# Patient Record
Sex: Male | Born: 1950
Health system: Southern US, Community
[De-identification: ages and names within clinical notes are randomized; demographics above are authoritative.]

---

## 2001-03-23 ENCOUNTER — Ambulatory Visit (HOSPITAL_BASED_OUTPATIENT_CLINIC_OR_DEPARTMENT_OTHER): Admission: RE | Admit: 2001-03-23 | Discharge: 2001-03-23 | Payer: Self-pay | Admitting: Plastic Surgery

## 2001-03-23 ENCOUNTER — Encounter (INDEPENDENT_AMBULATORY_CARE_PROVIDER_SITE_OTHER): Payer: Self-pay | Admitting: *Deleted

## 2010-08-26 ENCOUNTER — Encounter (INDEPENDENT_AMBULATORY_CARE_PROVIDER_SITE_OTHER): Payer: Self-pay | Admitting: General Surgery

## 2010-08-26 ENCOUNTER — Inpatient Hospital Stay (HOSPITAL_COMMUNITY)
Admission: RE | Admit: 2010-08-26 | Discharge: 2010-08-31 | Payer: Self-pay | Source: Home / Self Care | Attending: General Surgery | Admitting: General Surgery

## 2010-08-27 LAB — BASIC METABOLIC PANEL
BUN: 15 mg/dL (ref 6–23)
CO2: 28 mEq/L (ref 19–32)
Calcium: 8.2 mg/dL — ABNORMAL LOW (ref 8.4–10.5)
Chloride: 105 mEq/L (ref 96–112)
Creatinine, Ser: 1.28 mg/dL (ref 0.4–1.5)
GFR calc Af Amer: >60 mL/min (ref >60)
GFR calc non Af Amer: 58 mL/min — ABNORMAL LOW (ref >60)
Glucose, Bld: 156 mg/dL — ABNORMAL HIGH (ref 70–99)
Potassium: 4.4 mEq/L (ref 3.5–5.1)
Sodium: 137 mEq/L (ref 135–145)

## 2010-08-27 LAB — CBC
HCT: 33.2 % — ABNORMAL LOW (ref 39.0–52.0)
Hemoglobin: 11.2 g/dL — ABNORMAL LOW (ref 13.0–17.0)
MCH: 32.8 pg (ref 26.0–34.0)
MCHC: 33.7 g/dL (ref 30.0–36.0)
MCV: 97.4 fL (ref 78.0–100.0)
Platelets: 192 10*3/uL (ref 150–400)
RBC: 3.41 MIL/uL — ABNORMAL LOW (ref 4.22–5.81)
RDW: 12.6 % (ref 11.5–15.5)
WBC: 11 10*3/uL — ABNORMAL HIGH (ref 4.0–10.5)

## 2010-08-29 LAB — CBC
HCT: 29.7 % — ABNORMAL LOW (ref 39.0–52.0)
Hemoglobin: 10 g/dL — ABNORMAL LOW (ref 13.0–17.0)
MCH: 32.8 pg (ref 26.0–34.0)
MCHC: 33.7 g/dL (ref 30.0–36.0)
MCV: 97.4 fL (ref 78.0–100.0)
Platelets: 160 10*3/uL (ref 150–400)
RBC: 3.05 MIL/uL — ABNORMAL LOW (ref 4.22–5.81)
RDW: 12.3 % (ref 11.5–15.5)
WBC: 6.8 10*3/uL (ref 4.0–10.5)

## 2010-08-29 LAB — BASIC METABOLIC PANEL
BUN: 5 mg/dL — ABNORMAL LOW (ref 6–23)
CO2: 28 mEq/L (ref 19–32)
Calcium: 8.4 mg/dL (ref 8.4–10.5)
Chloride: 102 mEq/L (ref 96–112)
Creatinine, Ser: 1.07 mg/dL (ref 0.4–1.5)
GFR calc Af Amer: >60 mL/min (ref >60)
GFR calc non Af Amer: >60 mL/min (ref >60)
Glucose, Bld: 131 mg/dL — ABNORMAL HIGH (ref 70–99)
Potassium: 4.1 mEq/L (ref 3.5–5.1)
Sodium: 135 mEq/L (ref 135–145)

## 2010-09-19 NOTE — Discharge Summary (Addendum)
  NAME:  Charles Jackson, Charles Jackson               ACCOUNT NO.:  [id number]  MEDICAL RECORD NO.:  [id number]          PATIENT TYPE:  INP  LOCATION:  1531                         FACILITY:  [facility]  PHYSICIAN:  Angelia Mould. Derrell Lolling, M.D.DATE OF BIRTH:  [birth year]  DATE OF ADMISSION:  08/26/2010 DATE OF DISCHARGE:  08/31/2010                              DISCHARGE SUMMARY   FINAL DIAGNOSES: 1. 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 2. Tobacco abuse.  OPERATIONS PERFORMED:  Laparoscopic-assisted right colectomy, date August 26, 2010.  HISTORY:  This is a 60 year old Caucasian man who is asymptomatic and had a screening colonoscopy, which showed a 3 cm mass in the cecum, possibly associated with the appendiceal orifice.  Biopsy showed villous adenoma with high-grade dysplasia.  He was referred for surgical management.  He was counseled as an outpatient.  He underwent bowel prep at home and was brought to the operating room electively.  HOSPITAL COURSE:  On the day of admission, the patient underwent a laparoscopic-assisted right colectomy.  He had a 3 cm soft palpable mass in the cecum near the ileocecal valve.  The appendix looked normal. There were no enlarged lymph nodes.  The rest of the exploration was normal.  Final pathology report showed intra- mucosal adenocarcinoma arising in a large tubulovillous polyp.  There was no evidence of lymphovascular invasion.  Twenty two lymph nodes were evaluated and all of these were negative for cancer.  The resection margins were clear.  Postoperatively, the patient did well.  He had an ileus for a couple of days.  He was placed on usual postoperative management protocols and care path.  His ileus resolved after about 3 or 4 days.  He began having bowel movements and was advanced to a regular diet.  He was discharged home on January 8.  At that time, he was having bowel movements, feeling  well, and was ready to go home.  His wounds looked good.  He was given instructions in diet, activities.  He was given a prescription for Vicodin for pain.  He was asked to return to see me in the office in 1 week for wound check.  His pathology report was discussed with him.     Angelia Mould. Derrell Lolling, M.D.     HMI/MEDQ  D:  09/18/2010  T:  09/18/2010  Job:  [id number]  cc:   Willis Modena, MD Fax: [phone]  Milus Height, PA-C  Electronically Signed by Claud Kelp M.D. on 09/19/2010 08:59:07 AM

## 2010-11-03 LAB — COMPREHENSIVE METABOLIC PANEL
ALT: 18 U/L (ref 0–53)
AST: 22 U/L (ref 0–37)
Albumin: 3.8 g/dL (ref 3.5–5.2)
Alkaline Phosphatase: 79 U/L (ref 39–117)
BUN: 17 mg/dL (ref 6–23)
CO2: 29 mEq/L (ref 19–32)
Calcium: 9.5 mg/dL (ref 8.4–10.5)
Chloride: 105 mEq/L (ref 96–112)
Creatinine, Ser: 1.18 mg/dL (ref 0.4–1.5)
GFR calc Af Amer: >60 mL/min (ref >60)
GFR calc non Af Amer: >60 mL/min (ref >60)
Glucose, Bld: 103 mg/dL — ABNORMAL HIGH (ref 70–99)
Potassium: 5.3 mEq/L — ABNORMAL HIGH (ref 3.5–5.1)
Sodium: 140 mEq/L (ref 135–145)
Total Bilirubin: 0.4 mg/dL (ref 0.3–1.2)
Total Protein: 7.1 g/dL (ref 6.0–8.3)

## 2010-11-03 LAB — URINALYSIS, ROUTINE W REFLEX MICROSCOPIC
Bilirubin Urine: NEGATIVE
Glucose, UA: NEGATIVE mg/dL
Hgb urine dipstick: NEGATIVE
Ketones, ur: NEGATIVE mg/dL
Nitrite: NEGATIVE
Protein, ur: NEGATIVE mg/dL
Specific Gravity, Urine: 1.004 — ABNORMAL LOW (ref 1.005–1.030)
Urobilinogen, UA: 0.2 mg/dL (ref 0.0–1.0)
pH: 5.5 (ref 5.0–8.0)

## 2010-11-03 LAB — CBC
HCT: 45.9 % (ref 39.0–52.0)
Hemoglobin: 15.2 g/dL (ref 13.0–17.0)
MCH: 32.6 pg (ref 26.0–34.0)
MCHC: 33.1 g/dL (ref 30.0–36.0)
MCV: 98.5 fL (ref 78.0–100.0)
Platelets: 257 10*3/uL (ref 150–400)
RBC: 4.66 MIL/uL (ref 4.22–5.81)
RDW: 12.9 % (ref 11.5–15.5)
WBC: 6.9 10*3/uL (ref 4.0–10.5)

## 2010-11-03 LAB — DIFFERENTIAL
Basophils Absolute: 0 10*3/uL (ref 0.0–0.1)
Basophils Relative: 1 % (ref 0–1)
Eosinophils Absolute: 0.2 10*3/uL (ref 0.0–0.7)
Eosinophils Relative: 2 % (ref 0–5)
Lymphocytes Relative: 27 % (ref 12–46)
Lymphs Abs: 1.8 10*3/uL (ref 0.7–4.0)
Monocytes Absolute: 0.6 10*3/uL (ref 0.1–1.0)
Monocytes Relative: 8 % (ref 3–12)
Neutro Abs: 4.3 10*3/uL (ref 1.7–7.7)
Neutrophils Relative %: 63 % (ref 43–77)

## 2010-11-03 LAB — SURGICAL PCR SCREEN
MRSA, PCR: NEGATIVE
Staphylococcus aureus: NEGATIVE

## 2011-08-14 ENCOUNTER — Other Ambulatory Visit: Payer: Self-pay | Admitting: Gastroenterology

## 2011-09-14 ENCOUNTER — Encounter (INDEPENDENT_AMBULATORY_CARE_PROVIDER_SITE_OTHER): Payer: Self-pay | Admitting: Gastroenterology

## 2018-03-02 DIAGNOSIS — H3561 Retinal hemorrhage, right eye: Secondary | ICD-10-CM | POA: Diagnosis not present

## 2018-04-11 ENCOUNTER — Other Ambulatory Visit: Payer: Self-pay | Admitting: *Deleted

## 2018-04-11 NOTE — Patient Outreach (Addendum)
Telephone call to pt for follow up on HTA health risk assessment completed,  RN CM attempted 3 times to reach pt on 03/28/18, 04/04/18 and 04/08/18, no answer to telephone and left several voicemails requesting return phone call.  Pt did not return calls. Unsuccessful outreach letter mailed to pt home with 24 hour nurse line magnet.  Irving ShowsJulie Jalea Bronaugh Kil[facility]arch[facility] Residential Treatment CenterRNC, BSN [facility] Community Care Coordinator [phone]

## 2018-05-30 ENCOUNTER — Other Ambulatory Visit: Payer: Self-pay | Admitting: Physician Assistant

## 2018-05-30 DIAGNOSIS — Z136 Encounter for screening for cardiovascular disorders: Secondary | ICD-10-CM

## 2018-05-30 DIAGNOSIS — Z23 Encounter for immunization: Secondary | ICD-10-CM | POA: Diagnosis not present

## 2018-05-30 DIAGNOSIS — Z85038 Personal history of other malignant neoplasm of large intestine: Secondary | ICD-10-CM | POA: Diagnosis not present

## 2018-05-30 DIAGNOSIS — Z1159 Encounter for screening for other viral diseases: Secondary | ICD-10-CM | POA: Diagnosis not present

## 2018-05-30 DIAGNOSIS — Z0000 Encounter for general adult medical examination without abnormal findings: Secondary | ICD-10-CM | POA: Diagnosis not present

## 2018-05-30 DIAGNOSIS — Z125 Encounter for screening for malignant neoplasm of prostate: Secondary | ICD-10-CM | POA: Diagnosis not present

## 2018-05-30 DIAGNOSIS — E7800 Pure hypercholesterolemia, unspecified: Secondary | ICD-10-CM | POA: Diagnosis not present

## 2018-06-02 DIAGNOSIS — H3561 Retinal hemorrhage, right eye: Secondary | ICD-10-CM | POA: Diagnosis not present

## 2018-06-07 ENCOUNTER — Ambulatory Visit
Admission: RE | Admit: 2018-06-07 | Discharge: 2018-06-07 | Disposition: A | Discharge status: Home / Self Care | Payer: Self-pay | Source: Ambulatory Visit | Attending: Physician Assistant | Admitting: Physician Assistant

## 2018-06-07 DIAGNOSIS — Z136 Encounter for screening for cardiovascular disorders: Secondary | ICD-10-CM | POA: Diagnosis not present

## 2018-06-07 DIAGNOSIS — Z87891 Personal history of nicotine dependence: Secondary | ICD-10-CM | POA: Diagnosis not present

## 2018-09-12 DIAGNOSIS — E7800 Pure hypercholesterolemia, unspecified: Secondary | ICD-10-CM | POA: Diagnosis not present

## 2018-09-26 DIAGNOSIS — E7800 Pure hypercholesterolemia, unspecified: Secondary | ICD-10-CM | POA: Diagnosis not present

## 2019-01-02 DIAGNOSIS — N289 Disorder of kidney and ureter, unspecified: Secondary | ICD-10-CM | POA: Diagnosis not present

## 2019-06-12 DIAGNOSIS — E7800 Pure hypercholesterolemia, unspecified: Secondary | ICD-10-CM | POA: Diagnosis not present

## 2019-06-12 DIAGNOSIS — Z125 Encounter for screening for malignant neoplasm of prostate: Secondary | ICD-10-CM | POA: Diagnosis not present

## 2019-06-12 DIAGNOSIS — Z131 Encounter for screening for diabetes mellitus: Secondary | ICD-10-CM | POA: Diagnosis not present

## 2019-06-12 DIAGNOSIS — Z85038 Personal history of other malignant neoplasm of large intestine: Secondary | ICD-10-CM | POA: Diagnosis not present

## 2019-06-12 DIAGNOSIS — Z0000 Encounter for general adult medical examination without abnormal findings: Secondary | ICD-10-CM | POA: Diagnosis not present

## 2019-06-12 DIAGNOSIS — Z23 Encounter for immunization: Secondary | ICD-10-CM | POA: Diagnosis not present

## 2019-09-13 DIAGNOSIS — R739 Hyperglycemia, unspecified: Secondary | ICD-10-CM | POA: Diagnosis not present

## 2019-09-13 DIAGNOSIS — E7800 Pure hypercholesterolemia, unspecified: Secondary | ICD-10-CM | POA: Diagnosis not present

## 2019-10-26 ENCOUNTER — Ambulatory Visit: Payer: Medicare HMO | Attending: Internal Medicine

## 2019-10-26 DIAGNOSIS — Z23 Encounter for immunization: Secondary | ICD-10-CM | POA: Insufficient documentation

## 2019-10-26 NOTE — Progress Notes (Signed)
   Covid-19 Vaccination Clinic  Name:  Charles Jackson    MRN: [id number] DOB: Jul 04, 1951  10/26/2019  [name] was observed post Covid-19 immunization for 15 minutes without incident. He was provided with Vaccine Information Sheet and instruction to access the V-Safe system.   [name] was instructed to call 911 with any severe reactions post vaccine: Marland Kitchen Difficulty breathing  . Swelling of face and throat  . A fast heartbeat  . A bad rash all over body  . Dizziness and weakness   Immunizations Administered    Name Date Dose VIS Date Route   Pfizer COVID-19 Vaccine 10/26/2019  9:01 AM 0.3 mL 08/04/2019 Intramuscular   Manufacturer: [employer], [employer]   Lot: [id number]   NDC: [address]-5

## 2019-11-16 ENCOUNTER — Ambulatory Visit: Payer: Medicare HMO | Attending: Internal Medicine

## 2019-11-16 DIAGNOSIS — Z23 Encounter for immunization: Secondary | ICD-10-CM

## 2019-11-16 DIAGNOSIS — R69 Illness, unspecified: Secondary | ICD-10-CM | POA: Diagnosis not present

## 2019-11-16 NOTE — Progress Notes (Signed)
   Covid-19 Vaccination Clinic  Name:  Charles Jackson    MRN: [id number] DOB: 08/10/1951  11/16/2019  Mr. Mccoy was observed post Covid-19 immunization for 15 minutes without incident. He was provided with Vaccine Information Sheet and instruction to access the V-Safe system.   Mr. Langham was instructed to call 911 with any severe reactions post vaccine: Marland Kitchen Difficulty breathing  . Swelling of face and throat  . A fast heartbeat  . A bad rash all over body  . Dizziness and weakness   Immunizations Administered    Name Date Dose VIS Date Route   Pfizer COVID-19 Vaccine 11/16/2019  9:13 AM 0.3 mL 08/04/2019 Intramuscular   Manufacturer: [employer], [employer]   Lot: [id number]   NDC: [address]-5

## 2019-12-16 DIAGNOSIS — R030 Elevated blood-pressure reading, without diagnosis of hypertension: Secondary | ICD-10-CM | POA: Diagnosis not present

## 2019-12-16 DIAGNOSIS — E785 Hyperlipidemia, unspecified: Secondary | ICD-10-CM | POA: Diagnosis not present

## 2019-12-16 DIAGNOSIS — Z87891 Personal history of nicotine dependence: Secondary | ICD-10-CM | POA: Diagnosis not present

## 2019-12-16 DIAGNOSIS — Z85828 Personal history of other malignant neoplasm of skin: Secondary | ICD-10-CM | POA: Diagnosis not present

## 2020-01-11 DIAGNOSIS — R69 Illness, unspecified: Secondary | ICD-10-CM | POA: Diagnosis not present

## 2020-02-19 DIAGNOSIS — E7800 Pure hypercholesterolemia, unspecified: Secondary | ICD-10-CM | POA: Diagnosis not present

## 2020-03-12 DIAGNOSIS — R69 Illness, unspecified: Secondary | ICD-10-CM | POA: Diagnosis not present

## 2020-06-17 DIAGNOSIS — Z789 Other specified health status: Secondary | ICD-10-CM | POA: Diagnosis not present

## 2020-06-17 DIAGNOSIS — E7800 Pure hypercholesterolemia, unspecified: Secondary | ICD-10-CM | POA: Diagnosis not present

## 2020-06-17 DIAGNOSIS — Z131 Encounter for screening for diabetes mellitus: Secondary | ICD-10-CM | POA: Diagnosis not present

## 2020-06-17 DIAGNOSIS — Z125 Encounter for screening for malignant neoplasm of prostate: Secondary | ICD-10-CM | POA: Diagnosis not present

## 2020-06-17 DIAGNOSIS — G720 Drug-induced myopathy: Secondary | ICD-10-CM | POA: Diagnosis not present

## 2020-06-17 DIAGNOSIS — Z23 Encounter for immunization: Secondary | ICD-10-CM | POA: Diagnosis not present

## 2020-06-17 DIAGNOSIS — Z0000 Encounter for general adult medical examination without abnormal findings: Secondary | ICD-10-CM | POA: Diagnosis not present

## 2020-06-17 DIAGNOSIS — Z122 Encounter for screening for malignant neoplasm of respiratory organs: Secondary | ICD-10-CM | POA: Diagnosis not present

## 2020-06-17 DIAGNOSIS — Z85038 Personal history of other malignant neoplasm of large intestine: Secondary | ICD-10-CM | POA: Diagnosis not present

## 2020-07-16 ENCOUNTER — Other Ambulatory Visit: Payer: Self-pay | Admitting: *Deleted

## 2020-07-16 DIAGNOSIS — Z87891 Personal history of nicotine dependence: Secondary | ICD-10-CM

## 2020-08-01 DIAGNOSIS — Z1159 Encounter for screening for other viral diseases: Secondary | ICD-10-CM | POA: Diagnosis not present

## 2020-08-06 DIAGNOSIS — Z85038 Personal history of other malignant neoplasm of large intestine: Secondary | ICD-10-CM | POA: Diagnosis not present

## 2020-08-12 ENCOUNTER — Other Ambulatory Visit: Payer: Self-pay

## 2020-08-12 ENCOUNTER — Encounter: Payer: Self-pay | Admitting: Acute Care

## 2020-08-12 ENCOUNTER — Ambulatory Visit (INDEPENDENT_AMBULATORY_CARE_PROVIDER_SITE_OTHER): Payer: Medicare HMO | Admitting: Acute Care

## 2020-08-12 ENCOUNTER — Ambulatory Visit (INDEPENDENT_AMBULATORY_CARE_PROVIDER_SITE_OTHER)
Admission: RE | Admit: 2020-08-12 | Discharge: 2020-08-12 | Disposition: A | Discharge status: Home / Self Care | Payer: Medicare HMO | Source: Ambulatory Visit | Attending: Acute Care | Admitting: Acute Care

## 2020-08-12 VITALS — BP 132/82 | HR 85 | Temp 97.2°F | Ht 68.0 in | Wt 147.2 lb

## 2020-08-12 DIAGNOSIS — Z87891 Personal history of nicotine dependence: Secondary | ICD-10-CM

## 2020-08-12 DIAGNOSIS — J439 Emphysema, unspecified: Secondary | ICD-10-CM | POA: Diagnosis not present

## 2020-08-12 DIAGNOSIS — Z122 Encounter for screening for malignant neoplasm of respiratory organs: Secondary | ICD-10-CM

## 2020-08-12 NOTE — Progress Notes (Signed)
Shared Decision Making Visit Lung Cancer Screening Program [phone])   Eligibility:  Age 69 y.o.  Pack Years Smoking History Calculation 40 pack year smoking history (# packs/per year x # years smoked)  Recent History of coughing up blood  no  Unexplained weight loss? no ( >Than 15 pounds within the last 6 months )  Prior History Lung / other cancer no (Diagnosis within the last 5 years already requiring surveillance chest CT Scans).  Smoking Status Former Smoker  Former Smokers: Years since quit: 10 years  Quit Date: 10 years  Visit Components:  Discussion included one or more decision making aids. yes  Discussion included risk/benefits of screening. yes  Discussion included potential follow up diagnostic testing for abnormal scans. yes  Discussion included meaning and risk of over diagnosis. yes  Discussion included meaning and risk of False Positives. yes  Discussion included meaning of total radiation exposure. yes  Counseling Included:  Importance of adherence to annual lung cancer LDCT screening. yes  Impact of comorbidities on ability to participate in the program. yes  Ability and willingness to under diagnostic treatment. yes  Smoking Cessation Counseling:  Current Smokers:   Discussed importance of smoking cessation. yes  Information about tobacco cessation classes and interventions provided to patient. yes  Patient provided with "ticket" for LDCT Scan. yes  Symptomatic Patient. no  Counseling  Diagnosis Code: Tobacco Use Z72.0  Asymptomatic Patient yes  Counseling (Intermediate counseling: > three minutes counseling) H4174  Former Smokers:   Discussed the importance of maintaining cigarette abstinence. yes  Diagnosis Code: Personal History of Nicotine Dependence. Y81.448  Information about tobacco cessation classes and interventions provided to patient. Yes  Patient provided with "ticket" for LDCT Scan. yes  Written Order for Lung  Cancer Screening with LDCT placed in Epic. Yes (CT Chest Lung Cancer Screening Low Dose W/O CM) [id number] Z12.2-Screening of respiratory organs Z87.891-Personal history of nicotine dependence  BP 132/82 (BP Location: Left Arm, Cuff Size: Normal)   Pulse 85   Temp (!) 97.2 F (36.2 C) (Oral)   Ht 5\' 8"  (1.727 m)   Wt 147 lb 3.2 oz (66.8 kg)   SpO2 98%   BMI 22.38 kg/m     I spent 25 minutes of face to face time with Charles Jackson discussing the risks and benefits of lung cancer screening. We viewed a power point together that explained in detail the above noted topics. We took the time to pause the power point at intervals to allow for questions to be asked and answered to ensure understanding. We discussed that he had taken the single most powerful action possible to decrease his risk of developing lung cancer when he quit smoking. I counseled him to remain smoke free, and to contact me if he ever had the desire to smoke again so that I can provide resources and tools to help support the effort to remain smoke free. We discussed the time and location of the scan, and that either  RN or I will call with the results within  24-48 hours of receiving them. He has my card and contact information in the event he needs to speak with me, in addition to a copy of the power point we reviewed as a resource. He verbalized understanding of all of the above and had no further questions upon leaving the office.     I explained to the patient that there has been a high incidence of coronary artery disease noted on these  exams. I explained that this is a non-gated exam therefore degree or severity cannot be determined. This patient is not on statin therapy per the Epic medlist. I have asked the patient to follow-up with their PCP regarding any incidental finding of coronary artery disease and management with diet or medication as they feel is clinically indicated. The patient verbalized understanding of  the above and had no further questions.    Bevelyn Ngo, NP 08/12/2020

## 2020-08-12 NOTE — Patient Instructions (Signed)
Thank you for participating in the [facility] Cancer Screening Program. It was our pleasure to meet you today. We will call you with the results of your scan within the next few days. Your scan will be assigned a Lung RADS category score by the physicians reading the scans.  This Lung RADS score determines follow up scanning.  See below for description of categories, and follow up screening recommendations. We will be in touch to schedule your follow up screening annually or based on recommendations of our providers. We will fax a copy of your scan results to your Primary Care Physician, or the physician who referred you to the program, to ensure they have the results. Please call the office if you have any questions or concerns regarding your scanning experience or results.  Our office number is [phone]. Please speak with Denise Phelps, RN. She is our Lung Cancer Screening RN. If she is unavailable when you call, please have the office staff send her a message. She will return your call at her earliest convenience. Remember, if your scan is normal, we will scan you annually as long as you continue to meet the criteria for the program. (Age 55-77, Current smoker or smoker who has quit within the last 15 years). If you are a smoker, remember, quitting is the single most powerful action that you can take to decrease your risk of lung cancer and other pulmonary, breathing related problems. We know quitting is hard, and we are here to help.  Please let us know if there is anything we can do to help you meet your goal of quitting. If you are a former smoker, congratulations. We are proud of you! Remain smoke free! Remember you can refer friends or family members through the number above.  We will screen them to make sure they meet criteria for the program. Thank you for helping us take better care of you by participating in Lung Screening.  Lung RADS Categories:  Lung RADS 1: no nodules  or definitely non-concerning nodules.  Recommendation is for a repeat annual scan in 12 months.  Lung RADS 2:  nodules that are non-concerning in appearance and behavior with a very low likelihood of becoming an active cancer. Recommendation is for a repeat annual scan in 12 months.  Lung RADS 3: nodules that are probably non-concerning , includes nodules with a low likelihood of becoming an active cancer.  Recommendation is for a 6-month repeat screening scan. Often noted after an upper respiratory illness. We will be in touch to make sure you have no questions, and to schedule your 6-month scan.  Lung RADS 4 A: nodules with concerning findings, recommendation is most often for a follow up scan in 3 months or additional testing based on our provider's assessment of the scan. We will be in touch to make sure you have no questions and to schedule the recommended 3 month follow up scan.  Lung RADS 4 B:  indicates findings that are concerning. We will be in touch with you to schedule additional diagnostic testing based on our provider's  assessment of the scan.   

## 2020-08-26 NOTE — Progress Notes (Signed)
Please call patient and let them  know their  low dose Ct was read as a Lung RADS 2: nodules that are benign in appearance and behavior with a very low likelihood of becoming a clinically active cancer due to size or lack of growth. Recommendation per radiology is for a repeat LDCT in 12 months. .Please let them  know we will order and schedule their  annual screening scan for 07/2021. Please let them  know there was notation of CAD on their  scan.  Please remind the patient  that this is a non-gated exam therefore degree or severity of disease  cannot be determined. Please have them  follow up with their PCP regarding potential risk factor modification, dietary therapy or pharmacologic therapy if clinically indicated. Pt.  is not currently on statin therapy. Please place order for annual  screening scan for  07/2021 and fax results to PCP. Thanks so much. 

## 2020-08-27 ENCOUNTER — Other Ambulatory Visit: Payer: Self-pay | Admitting: *Deleted

## 2020-08-27 DIAGNOSIS — Z87891 Personal history of nicotine dependence: Secondary | ICD-10-CM

## 2020-11-15 DIAGNOSIS — Z87891 Personal history of nicotine dependence: Secondary | ICD-10-CM | POA: Diagnosis not present

## 2020-11-15 DIAGNOSIS — Z85828 Personal history of other malignant neoplasm of skin: Secondary | ICD-10-CM | POA: Diagnosis not present

## 2020-11-15 DIAGNOSIS — R030 Elevated blood-pressure reading, without diagnosis of hypertension: Secondary | ICD-10-CM | POA: Diagnosis not present

## 2020-11-15 DIAGNOSIS — R69 Illness, unspecified: Secondary | ICD-10-CM | POA: Diagnosis not present

## 2020-11-15 DIAGNOSIS — Z809 Family history of malignant neoplasm, unspecified: Secondary | ICD-10-CM | POA: Diagnosis not present

## 2020-11-15 DIAGNOSIS — E785 Hyperlipidemia, unspecified: Secondary | ICD-10-CM | POA: Diagnosis not present

## 2021-07-02 DIAGNOSIS — Z23 Encounter for immunization: Secondary | ICD-10-CM | POA: Diagnosis not present

## 2021-07-02 DIAGNOSIS — I700 Atherosclerosis of aorta: Secondary | ICD-10-CM | POA: Diagnosis not present

## 2021-07-02 DIAGNOSIS — E7800 Pure hypercholesterolemia, unspecified: Secondary | ICD-10-CM | POA: Diagnosis not present

## 2021-07-02 DIAGNOSIS — G720 Drug-induced myopathy: Secondary | ICD-10-CM | POA: Diagnosis not present

## 2021-07-02 DIAGNOSIS — Z0000 Encounter for general adult medical examination without abnormal findings: Secondary | ICD-10-CM | POA: Diagnosis not present

## 2021-07-02 DIAGNOSIS — Z125 Encounter for screening for malignant neoplasm of prostate: Secondary | ICD-10-CM | POA: Diagnosis not present

## 2021-07-02 DIAGNOSIS — J439 Emphysema, unspecified: Secondary | ICD-10-CM | POA: Diagnosis not present

## 2021-07-02 DIAGNOSIS — Z85038 Personal history of other malignant neoplasm of large intestine: Secondary | ICD-10-CM | POA: Diagnosis not present

## 2021-07-02 DIAGNOSIS — Z131 Encounter for screening for diabetes mellitus: Secondary | ICD-10-CM | POA: Diagnosis not present

## 2021-07-02 DIAGNOSIS — Z122 Encounter for screening for malignant neoplasm of respiratory organs: Secondary | ICD-10-CM | POA: Diagnosis not present

## 2021-08-21 ENCOUNTER — Other Ambulatory Visit: Payer: Self-pay | Admitting: Acute Care

## 2021-08-21 DIAGNOSIS — Z87891 Personal history of nicotine dependence: Secondary | ICD-10-CM

## 2021-09-01 DIAGNOSIS — E785 Hyperlipidemia, unspecified: Secondary | ICD-10-CM | POA: Diagnosis not present

## 2021-09-01 DIAGNOSIS — I951 Orthostatic hypotension: Secondary | ICD-10-CM | POA: Diagnosis not present

## 2021-09-01 DIAGNOSIS — Z85828 Personal history of other malignant neoplasm of skin: Secondary | ICD-10-CM | POA: Diagnosis not present

## 2021-09-01 DIAGNOSIS — Z87891 Personal history of nicotine dependence: Secondary | ICD-10-CM | POA: Diagnosis not present

## 2021-09-01 DIAGNOSIS — Z7722 Contact with and (suspected) exposure to environmental tobacco smoke (acute) (chronic): Secondary | ICD-10-CM | POA: Diagnosis not present

## 2021-09-01 DIAGNOSIS — R030 Elevated blood-pressure reading, without diagnosis of hypertension: Secondary | ICD-10-CM | POA: Diagnosis not present

## 2021-09-01 DIAGNOSIS — Z8249 Family history of ischemic heart disease and other diseases of the circulatory system: Secondary | ICD-10-CM | POA: Diagnosis not present

## 2021-09-04 ENCOUNTER — Ambulatory Visit (INDEPENDENT_AMBULATORY_CARE_PROVIDER_SITE_OTHER)
Admission: RE | Admit: 2021-09-04 | Discharge: 2021-09-04 | Disposition: A | Discharge status: Home / Self Care | Payer: Medicare HMO | Source: Ambulatory Visit | Attending: Cardiology | Admitting: Cardiology

## 2021-09-04 ENCOUNTER — Other Ambulatory Visit: Payer: Self-pay

## 2021-09-04 DIAGNOSIS — Z87891 Personal history of nicotine dependence: Secondary | ICD-10-CM | POA: Diagnosis not present

## 2021-09-05 ENCOUNTER — Other Ambulatory Visit: Payer: Self-pay | Admitting: Acute Care

## 2021-09-05 DIAGNOSIS — Z87891 Personal history of nicotine dependence: Secondary | ICD-10-CM

## 2021-10-10 DIAGNOSIS — R739 Hyperglycemia, unspecified: Secondary | ICD-10-CM | POA: Diagnosis not present

## 2022-07-04 IMAGING — CT CT CHEST LUNG CANCER SCREENING LOW DOSE W/O CM
1 series · 10 of 10 positions shown, 13 images · non-contrast
Comparison: 08/12/2020

CLINICAL DATA: Forty pack-year smoking history.  Ex-smoker.



[ct lung segmentation data · axial · 0.76mm/px · z∈[-393,-393]mm · 10 of 367 frames shown]
[frame 1/367  mediastinal]
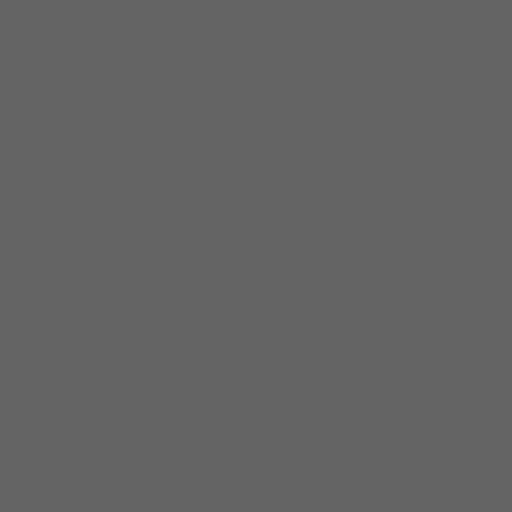
[frame 1/367  lung]
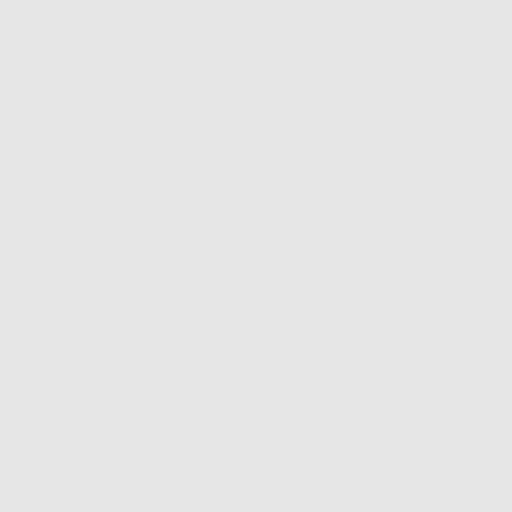
[frame 41/367  lung]
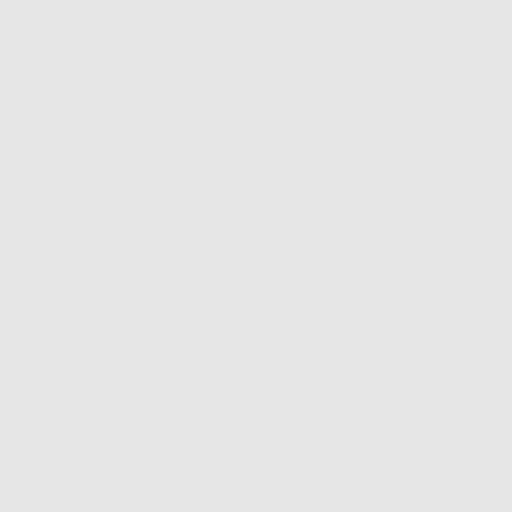
[frame 82/367  lung]
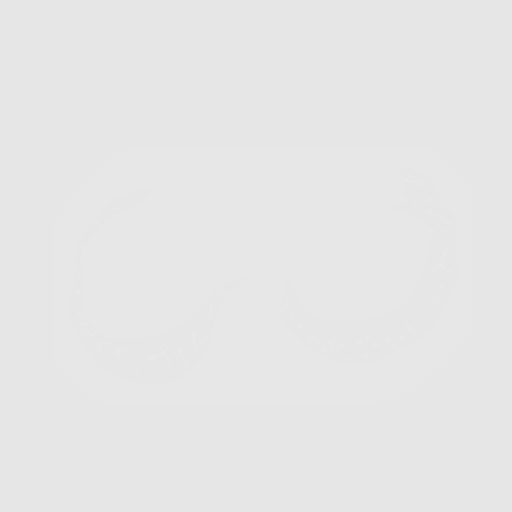
[frame 123/367  lung]
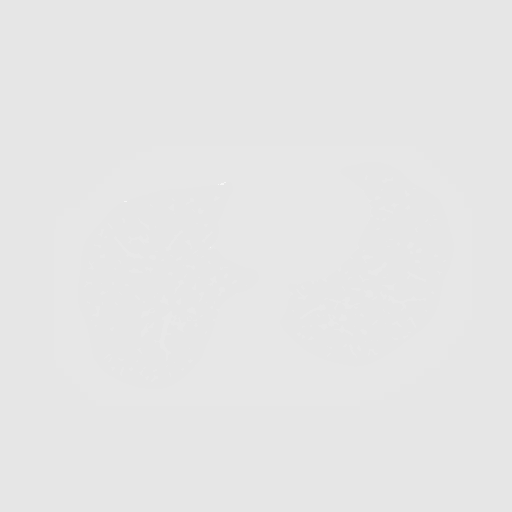
[frame 163/367  mediastinal]
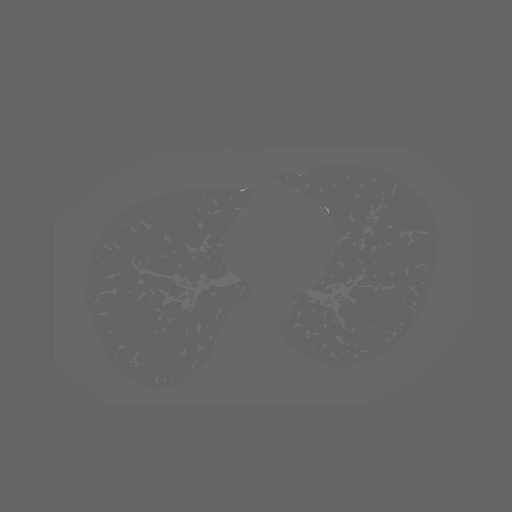
[frame 163/367  lung]
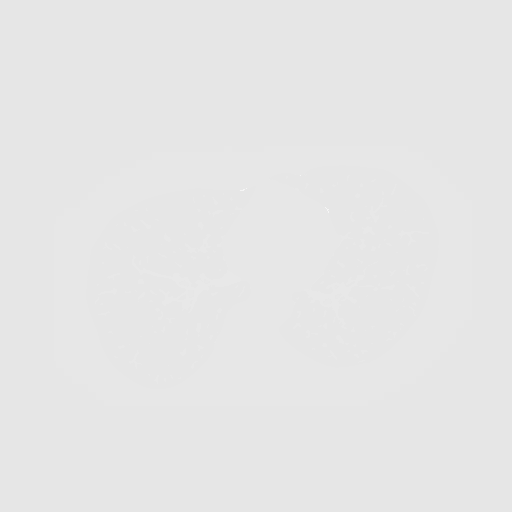
[frame 204/367  lung]
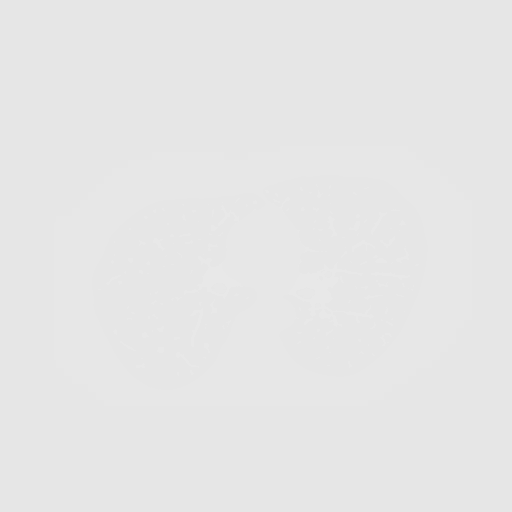
[frame 245/367  lung]
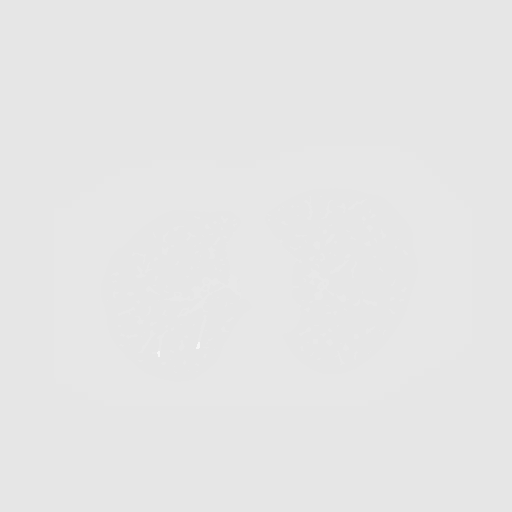
[frame 285/367  lung]
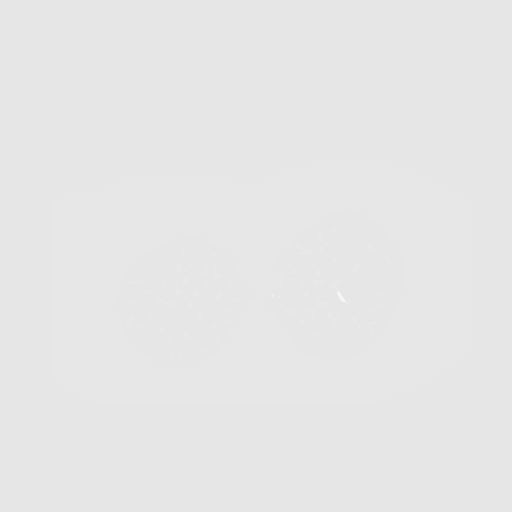
[frame 326/367  mediastinal]
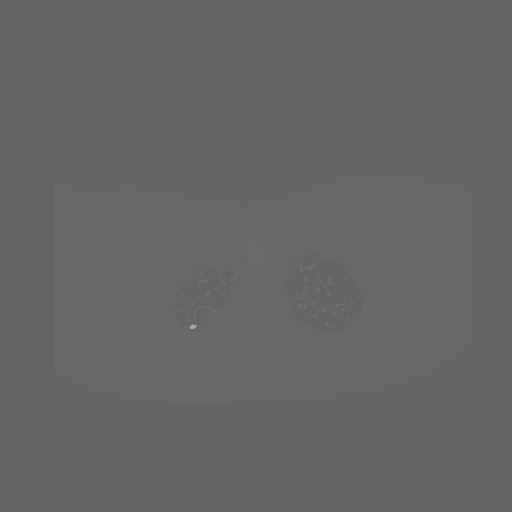
[frame 326/367  lung]
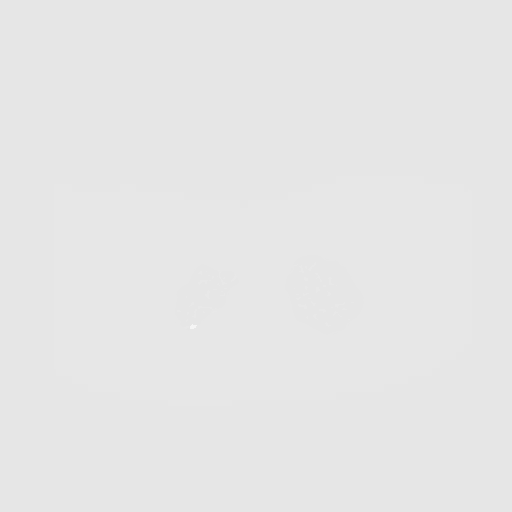
[frame 367/367  lung]
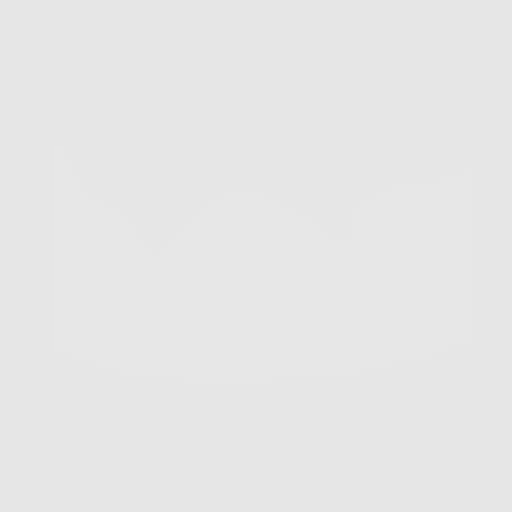

[10 of 10 positions shown; findings below may reference images not displayed]

FINDINGS: Cardiovascular: Aortic atherosclerosis. Normal heart size, without
pericardial effusion. Aortic valve calcification.

Mediastinum/Nodes: No mediastinal or definite hilar adenopathy,
given limitations of unenhanced CT.

Lungs/Pleura: No pleural fluid. Mild centrilobular and paraseptal
emphysema.

Peri fissural predominant pulmonary nodules of maximally volume
derived equivalent diameter 3.6 mm are not significantly changed.

Upper Abdomen: Normal imaged portions of the liver, spleen, stomach,
pancreas, adrenal glands, kidneys. Suspect small gallstones.

Musculoskeletal: No acute osseous abnormality.
IMPRESSION: 1. Lung-RADS 2, benign appearance or behavior. Continue annual
screening with low-dose chest CT without contrast in 12 months.
2. Aortic Atherosclerosis (FU6K1-DP9.9) and Emphysema (FU6K1-BR3.G).
3. Aortic valvular calcifications. Consider echocardiography to
evaluate for valvular dysfunction.

## 2022-07-13 DIAGNOSIS — R7303 Prediabetes: Secondary | ICD-10-CM | POA: Diagnosis not present

## 2022-07-13 DIAGNOSIS — Z0000 Encounter for general adult medical examination without abnormal findings: Secondary | ICD-10-CM | POA: Diagnosis not present

## 2022-07-13 DIAGNOSIS — Z23 Encounter for immunization: Secondary | ICD-10-CM | POA: Diagnosis not present

## 2022-07-13 DIAGNOSIS — E7800 Pure hypercholesterolemia, unspecified: Secondary | ICD-10-CM | POA: Diagnosis not present

## 2022-07-13 DIAGNOSIS — I700 Atherosclerosis of aorta: Secondary | ICD-10-CM | POA: Diagnosis not present

## 2022-07-13 DIAGNOSIS — Z85038 Personal history of other malignant neoplasm of large intestine: Secondary | ICD-10-CM | POA: Diagnosis not present

## 2022-07-13 DIAGNOSIS — Z125 Encounter for screening for malignant neoplasm of prostate: Secondary | ICD-10-CM | POA: Diagnosis not present

## 2022-07-13 DIAGNOSIS — Z122 Encounter for screening for malignant neoplasm of respiratory organs: Secondary | ICD-10-CM | POA: Diagnosis not present

## 2022-07-13 DIAGNOSIS — J439 Emphysema, unspecified: Secondary | ICD-10-CM | POA: Diagnosis not present

## 2022-07-13 DIAGNOSIS — G720 Drug-induced myopathy: Secondary | ICD-10-CM | POA: Diagnosis not present

## 2022-08-25 DIAGNOSIS — R69 Illness, unspecified: Secondary | ICD-10-CM | POA: Diagnosis not present

## 2022-09-04 ENCOUNTER — Ambulatory Visit
Admission: RE | Admit: 2022-09-04 | Discharge: 2022-09-04 | Disposition: A | Discharge status: Home / Self Care | Payer: Medicare HMO | Source: Ambulatory Visit | Attending: Physician Assistant | Admitting: Physician Assistant

## 2022-09-04 DIAGNOSIS — Z87891 Personal history of nicotine dependence: Secondary | ICD-10-CM

## 2022-09-07 ENCOUNTER — Other Ambulatory Visit: Payer: Self-pay | Admitting: Acute Care

## 2022-09-07 DIAGNOSIS — Z87891 Personal history of nicotine dependence: Secondary | ICD-10-CM

## 2022-09-07 DIAGNOSIS — Z122 Encounter for screening for malignant neoplasm of respiratory organs: Secondary | ICD-10-CM

## 2022-09-30 ENCOUNTER — Encounter: Payer: Self-pay | Admitting: Acute Care

## 2022-10-07 ENCOUNTER — Ambulatory Visit: Payer: Medicare HMO | Attending: Interventional Cardiology | Admitting: Interventional Cardiology

## 2022-10-07 ENCOUNTER — Encounter: Payer: Self-pay | Admitting: Interventional Cardiology

## 2022-10-07 VITALS — BP 140/78 | HR 90 | Ht 66.0 in | Wt 149.4 lb

## 2022-10-07 DIAGNOSIS — I359 Nonrheumatic aortic valve disorder, unspecified: Secondary | ICD-10-CM

## 2022-10-07 DIAGNOSIS — I2510 Atherosclerotic heart disease of native coronary artery without angina pectoris: Secondary | ICD-10-CM

## 2022-10-07 DIAGNOSIS — E782 Mixed hyperlipidemia: Secondary | ICD-10-CM | POA: Diagnosis not present

## 2022-10-07 NOTE — Progress Notes (Signed)
Cardiology Office Note   Date:  10/07/2022   ID:  Charles Jackson, DOB [birth year], MRN [id number]  PCP:  Lennie Odor, PA    No chief complaint on file.  Aortic valve calcification  Wt Readings from Last 3 Encounters:  10/07/22 149 lb 6.4 oz (67.8 kg)  09/04/22 147 lb (66.7 kg)  08/12/20 147 lb 3.2 oz (66.8 kg)       History of Present Illness: Charles Jackson is a 72 y.o. male who is being seen today for the evaluation of coronary artery clacification at the request of [place], [place], [place].   Chest CT in Jan 2024 showed: "Cardiovascular: Heart size is normal. There is no significant pericardial fluid, thickening or pericardial calcification. There is aortic atherosclerosis, as well as atherosclerosis of the great vessels of the mediastinum and the coronary arteries, including calcified atherosclerotic plaque in the left anterior descending coronary artery. Calcifications of the aortic valve."  He walks a lot playing golf.  Walks 18 holes and has no cardiac sx.  No problems walking up the hills.  Quit smoking in 2009-11-15.   Wife, Nevin Bloodgood, passed away in 16-Nov-2019.  SHe had a healthy lifestyle.  Died from breast cancer,metastatic to the back 8 years after original diagnosis.    History reviewed. No pertinent past medical history.  History reviewed. No pertinent surgical history.   Current Outpatient Medications  Medication Sig Dispense Refill   [employer] GENTLE LAXATIVE 5 MG EC tablet      DENTA 5000 PLUS 1.1 % CREA dental cream Place 1 Application onto teeth at bedtime.     ezetimibe (ZETIA) 10 MG tablet      GAVILYTE-G 236 g solution SMARTSIG:Milliliter(s) By Mouth     Multiple Vitamin (M.V.I. ADULT IV)      Omega-3 Fatty Acids (FISH OIL) 1200 MG CAPS      No current facility-administered medications for this visit.    Allergies:   Atorvastatin, Iodine, Other, and Shellfish-derived products    Social History:  The patient  reports that he quit smoking about 13 years ago.  His smoking use included cigarettes. He has a 35.00 pack-year smoking history. He has never used smokeless tobacco. He reports that he does not currently use alcohol. He reports that he does not use drugs.   Family History:  The patient's family history includes Heart disease in his father.    ROS:  Please see the history of present illness.   Otherwise, review of systems are positive for difficulty lowering LDL on Zetia alone; occasional joint pains.   All other systems are reviewed and negative.    PHYSICAL EXAM: VS:  BP (!) 140/78   Pulse 90   Ht 5' 6"$  (1.676 m)   Wt 149 lb 6.4 oz (67.8 kg)   SpO2 97%   BMI 24.11 kg/m  , BMI Body mass index is 24.11 kg/m. GEN: Well nourished, well developed, in no acute distress HEENT: normal Neck: no JVD, carotid bruits, or masses Cardiac: RRR; no murmurs, rubs, or gallops,no edema  Respiratory:  clear to auscultation bilaterally, normal work of breathing GI: soft, nontender, nondistended, + BS MS: no deformity or atrophy Skin: warm and dry, no rash Neuro:  Strength and sensation are intact Psych: euthymic mood, full affect   EKG:   The ekg ordered today demonstrates normal sinus rhythm, no ST segment changes   Recent Labs: No results found for requested labs within last 365 days.   Lipid Panel No results  found for: "CHOL", "TRIG", "HDL", "CHOLHDL", "VLDL", "LDLCALC", "LDLDIRECT"   Other studies Reviewed: Additional studies/ records that were reviewed today with results demonstrating: LDL 126 in 11/23 while on Zetia.   ASSESSMENT AND PLAN:  Coronary artery calcification: No angina. Continue aggressive secondary prevention.  Healthy diet. High fiber diet.  Avoid processed foods.  Continue regular exercise.  If he has any change in his exercise tolerance or any exertional symptoms, he will let us know. Aortic valve calcification: No heart murmur on exam so I doubt there is any significant aortic stenosis.  Symptoms of lightheadedness,  passing out, fluid retention,  chest discomfort. Hyperlipidemia: intolerant of atorvastatin and rosuvastatin.   LDL of 126 was on Zetia for at least several months.  Referred to the Pharm.D. lipid clinic to see if there are other options to lower LDL better not statins Former smoker: Quit many years ago. Aortic atherosclerosis: Will refer to PharmD lipid clinic.  Would like to see LDL at least less than 100.   Current medicines are reviewed at length with the patient today.  The patient concerns regarding his medicines were addressed.  The following changes have been made:  No change  Labs/ tests ordered today include:  No orders of the defined types were placed in this encounter.   Recommend 150 minutes/week of aerobic exercise Low fat, low carb, high fiber diet recommended  Disposition:   FU in 1 year   Signed, Larae Grooms, MD  10/07/2022 3:04 PM    [facility] [address], [place], [place]  [address] Phone: [phone]; Fax: [phone]

## 2022-10-07 NOTE — Patient Instructions (Signed)
Medication Instructions:  Your physician recommends that you continue on your current medications as directed. Please refer to the Current Medication list given to you today.  *If you need a refill on your cardiac medications before your next appointment, please call your pharmacy*   Lab Work: none If you have labs (blood work) drawn today and your tests are completely normal, you will receive your results only by: [place] (if you have MyChart) OR A paper copy in the mail If you have any lab test that is abnormal or we need to change your treatment, we will call you to review the results.   Testing/Procedures: none   Follow-Up: At [facility], you and your health needs are our priority.  As part of our continuing mission to provide you with exceptional heart care, we have created designated Provider Care Teams.  These Care Teams include your primary Cardiologist (physician) and Advanced Practice Providers (APPs -  Physician Assistants and Nurse Practitioners) who all work together to provide you with the care you need, when you need it.  We recommend signing up for the patient portal called "MyChart".  Sign up information is provided on this After Visit Summary.  MyChart is used to connect with patients for Virtual Visits (Telemedicine).  Patients are able to view lab/test results, encounter notes, upcoming appointments, etc.  Non-urgent messages can be sent to your provider as well.   To learn more about what you can do with MyChart, go to [email or web address].    Your next appointment:   12 month(s)  Provider:   Larae Grooms, MD     Other Instructions You have been referred to see the pharmacist in the lipid clinic in our office.  Please schedule new patient appointment

## 2022-11-17 ENCOUNTER — Ambulatory Visit: Payer: Medicare HMO | Attending: Cardiology | Admitting: Pharmacist

## 2022-11-17 DIAGNOSIS — E782 Mixed hyperlipidemia: Secondary | ICD-10-CM | POA: Diagnosis not present

## 2022-11-17 DIAGNOSIS — E785 Hyperlipidemia, unspecified: Secondary | ICD-10-CM | POA: Insufficient documentation

## 2022-11-17 MED ORDER — PRAVASTATIN SODIUM 40 MG PO TABS: 40.0000 mg | ORAL_TABLET | Freq: Every evening | ORAL | 3 refills | Status: DC

## 2022-11-17 NOTE — Patient Instructions (Signed)
Start taking pravastatin 40mg  daily Continue ezetimibe 10mg  daily Labs on 6/18  Please call us at [phone] with any questions

## 2022-11-17 NOTE — Progress Notes (Signed)
Patient ID: Charles Jackson                 DOB: 1951-02-26                    MRN: [id number]      HPI: Charles Jackson is a 72 y.o. male patient referred to lipid clinic by  Dr. Irish Lack. PMH is significant for coronary artery calcification, aortic valve calcification, HLD  At visit with Dr. Irish Lack on 10/07/2022, patient reported being intolerant to atorvastatin and rosuvastatin. Last LDL-C from 07/13/2022 was 126.   At today's visit, patient reports past use of atorvastatin 10 mg and rosuvastatin 5mg  that resulted in hand weakness and muscle pains so much so he could not play golf. Since being off of those and only on ezetimibe 10mg , symptoms have improved but still has some cramping and aches in his hands and calves at times. Patient reports golfing 3-5 times/week and walking for exercise. Reviewed options for lowering LDL cholesterol including trying a different statin and PCSK- 9 inhibitors. Discussed mechanisms of action, dosing, side effects and potential decreases in LDL cholesterol.  Also reviewed cost information.   Current Medications: ezetimibe 10mg , fish oil  Intolerances: atorvastatin 10mg  (muscle pain), rosuvastatin 5mg  (muscle pain) Risk Factors: coronary artery calcification, aortic valve calcification  LDL-C goal: < 70 ApoB goal: < 80  Diet:  Pre-packaged food mostly chicken Ham deli meat, [place]  Vegetables raw Drink: green tea, water (rarely), coffee (no sugar, milk)   Exercise: golfer, walking Golf 3-5 times/week   Family History: Heart disease in father   Social History:  The patient  reports that he quit smoking about 13 years ago. His smoking use included cigarettes. He has a 35.00 pack-year smoking history. He has never used smokeless tobacco. He reports that he does not currently use alcohol. He reports that he does not use drugs   Labs: Lipid Panel  No results found for: "CHOL", "TRIG", "HDL", "CHOLHDL", "VLDL", "LDLCALC", "LDLDIRECT", "LABVLDL"  No  past medical history on file.  Current Outpatient Medications on File Prior to Visit  Medication Sig Dispense Refill   [employer] GENTLE LAXATIVE 5 MG EC tablet      DENTA 5000 PLUS 1.1 % CREA dental cream Place 1 Application onto teeth at bedtime.     ezetimibe (ZETIA) 10 MG tablet      GAVILYTE-G 236 g solution SMARTSIG:Milliliter(s) By Mouth     Multiple Vitamin (M.V.I. ADULT IV)      Omega-3 Fatty Acids (FISH OIL) 1200 MG CAPS      No current facility-administered medications on file prior to visit.    Allergies  Allergen Reactions   Atorvastatin Other (See Comments)   Iodine Other (See Comments)   Other Other (See Comments)   Shellfish-Derived Products Other (See Comments)    Assessment/Plan:  1. Hyperlipidemia -  Hyperlipemia Assessment:  Patient reports taking ezetimibe 10mg  daily  Last LDL-C from 07/13/22 was 126. Goal <70 Patient reports past use of atorvastatin 10mg  and rosuvastatin 5mg . He experienced hand weakness and muscle pains.  Patient reports golfing 3-5 times/week and walking for exercise.  Discussed the addition of pravastatin or a PCSK-9 inhibitor for additional LDL lowering.   Plan:  Start pravastatin 40mg  daily Continue ezetimibe 10mg  daily  Encouraged patient to continue diet and exercise for LDL lowering and CV risk reduction  If unable to tolerate pravastatin, we will plan to start a PCSK-9 inhibitor.  Follow-up labs Monday, June 18th  Thank you,  Candiss Norse PharmD Candidate Class of 2024  [place], [place].D, BCPS, CPP  [facility] [address], [place], [place] [address]  Phone: [phone]; Fax: [phone]

## 2022-11-17 NOTE — Assessment & Plan Note (Addendum)
Assessment:  Patient reports taking ezetimibe 10mg  daily  Last LDL-C from 07/13/22 was 126. Goal <70 Patient reports past use of atorvastatin 10mg  and rosuvastatin 5mg . He experienced hand weakness and muscle pains.  Patient reports golfing 3-5 times/week and walking for exercise.  Discussed the addition of pravastatin or a PCSK-9 inhibitor for additional LDL lowering.   Plan:  Start pravastatin 40mg  daily Continue ezetimibe 10mg  daily  Encouraged patient to continue diet and exercise for LDL lowering and CV risk reduction  If unable to tolerate pravastatin, we will plan to start a PCSK-9 inhibitor.  Follow-up labs Monday, June 18th

## 2023-02-09 ENCOUNTER — Ambulatory Visit: Payer: Medicare HMO | Attending: Interventional Cardiology

## 2023-02-09 DIAGNOSIS — E782 Mixed hyperlipidemia: Secondary | ICD-10-CM

## 2023-02-09 LAB — HEPATIC FUNCTION PANEL
ALT: 22 IU/L (ref 0–44)
AST: 26 IU/L (ref 0–40)
Albumin: 4.3 g/dL (ref 3.9–4.9)
Alkaline Phosphatase: 87 IU/L (ref 44–121)
Bilirubin Total: 0.4 mg/dL (ref 0.0–1.2)
Bilirubin, Direct: <0.1 mg/dL (ref 0.00–0.40)
Total Protein: 6.8 g/dL (ref 6.0–8.5)

## 2023-02-09 LAB — LIPID PANEL
Chol/HDL Ratio: 3 ratio (ref 0.0–5.0)
Cholesterol, Total: 170 mg/dL (ref 100–199)
HDL: 57 mg/dL (ref >39)
LDL Chol Calc (NIH): 92 mg/dL (ref 0–99)
Triglycerides: 115 mg/dL (ref 0–149)
VLDL Cholesterol Cal: 21 mg/dL (ref 5–40)

## 2023-02-10 ENCOUNTER — Other Ambulatory Visit (HOSPITAL_COMMUNITY): Payer: Self-pay

## 2023-02-10 ENCOUNTER — Telehealth: Payer: Self-pay | Admitting: Pharmacist

## 2023-02-10 ENCOUNTER — Telehealth: Payer: Self-pay

## 2023-02-10 DIAGNOSIS — E785 Hyperlipidemia, unspecified: Secondary | ICD-10-CM

## 2023-02-10 MED ORDER — REPATHA SURECLICK 140 MG/ML ~~LOC~~ SOAJ: 1.0000 mL | SUBCUTANEOUS | 11 refills | Status: DC

## 2023-02-10 NOTE — Addendum Note (Signed)
Addended by: Malena Peer D on: 02/10/2023 03:24 PM   Modules accepted: Orders

## 2023-02-10 NOTE — Telephone Encounter (Signed)
Patient made aware of approval. Rx sent in and pt scheduled for labs 9/12

## 2023-02-10 NOTE — Telephone Encounter (Signed)
LDL-C is above goal of <70 and non-HDL-C is above goal of <100 on pravastatin 40mg  and zetia. I spoke with patient. He is willing to add Repatha. Will submit PA. Reviewed efficacy and side effects.

## 2023-02-10 NOTE — Telephone Encounter (Signed)
Pharmacy Patient Advocate Encounter  Prior Authorization for REPATHA has been approved.    Effective dates: 08/24/22 through 08/24/23  Sigmond Patalano, CPhT Pharmacy Patient Advocate Specialist Direct Number: [phone] Fax: [phone] 

## 2023-02-10 NOTE — Telephone Encounter (Signed)
Pharmacy Patient Advocate Encounter   Received notification from [employer] that prior authorization for REPATHA 140 MG/ML INJ is needed.    PA submitted on 02/10/23 Key [id number] Status is pending  Haze Rushing, CPhT Pharmacy Patient Advocate Specialist Direct Number: [phone] Fax: [phone]

## 2023-02-10 NOTE — Telephone Encounter (Signed)
PA initiated, please see separate encounter for updates on determination. (I will route you back in once a decision has been made)  Also, this is the most helpful PA request I have ever seen. THANK YOU SO MUCH!   Haze Rushing, CPhT Pharmacy Patient Advocate Specialist Direct Number: [phone] Fax: [phone]

## 2023-03-29 DIAGNOSIS — H43812 Vitreous degeneration, left eye: Secondary | ICD-10-CM | POA: Diagnosis not present

## 2023-03-29 DIAGNOSIS — H5319 Other subjective visual disturbances: Secondary | ICD-10-CM | POA: Diagnosis not present

## 2023-04-22 NOTE — Telephone Encounter (Signed)
Patient called to inquire about refills on Repatha. I informed patient that Repatha prescription was ordered with 11 refills. He just need to call his pharmacy to refill the prescription.

## 2023-05-06 ENCOUNTER — Ambulatory Visit: Payer: Medicare HMO | Attending: Interventional Cardiology

## 2023-05-06 DIAGNOSIS — E785 Hyperlipidemia, unspecified: Secondary | ICD-10-CM

## 2023-05-07 ENCOUNTER — Telehealth: Payer: Self-pay | Admitting: Pharmacist

## 2023-05-07 LAB — LIPID PANEL
Chol/HDL Ratio: 1.4 ratio (ref 0.0–5.0)
Cholesterol, Total: 96 mg/dL — ABNORMAL LOW (ref 100–199)
HDL: 67 mg/dL (ref >39)
LDL Chol Calc (NIH): 11 mg/dL (ref 0–99)
Triglycerides: 96 mg/dL (ref 0–149)
VLDL Cholesterol Cal: 18 mg/dL (ref 5–40)

## 2023-05-07 NOTE — Addendum Note (Signed)
Addended by: Malena Peer D on: 05/07/2023 09:26 AM   Modules accepted: Orders

## 2023-05-07 NOTE — Telephone Encounter (Signed)
Spoke with patient about his cholesterol labs.  LDL-C is 11 on pravastatin 40, ezetimibe 10, Repatha 140 q. 14 days.  LDL-C is fantastic.  I think we can decrease his pill burden and stop ezetimibe.  Reviewed with patient who is in agreement.  Continue pravastatin 40 mg daily and Repatha.  He is to see his PCP sometime this winter and will have his labs rechecked at that time.

## 2023-06-29 DIAGNOSIS — H43391 Other vitreous opacities, right eye: Secondary | ICD-10-CM | POA: Diagnosis not present

## 2023-06-29 DIAGNOSIS — H11042 Peripheral pterygium, stationary, left eye: Secondary | ICD-10-CM | POA: Diagnosis not present

## 2023-06-29 DIAGNOSIS — H2513 Age-related nuclear cataract, bilateral: Secondary | ICD-10-CM | POA: Diagnosis not present

## 2023-06-29 DIAGNOSIS — H43812 Vitreous degeneration, left eye: Secondary | ICD-10-CM | POA: Diagnosis not present

## 2023-06-30 DIAGNOSIS — Z0100 Encounter for examination of eyes and vision without abnormal findings: Secondary | ICD-10-CM | POA: Diagnosis not present

## 2023-09-06 ENCOUNTER — Ambulatory Visit
Admission: RE | Admit: 2023-09-06 | Discharge: 2023-09-06 | Disposition: A | Discharge status: Home / Self Care | Payer: Medicare HMO | Source: Ambulatory Visit | Attending: Acute Care | Admitting: Acute Care

## 2023-09-06 DIAGNOSIS — Z122 Encounter for screening for malignant neoplasm of respiratory organs: Secondary | ICD-10-CM

## 2023-09-06 DIAGNOSIS — Z87891 Personal history of nicotine dependence: Secondary | ICD-10-CM | POA: Diagnosis not present

## 2023-09-16 ENCOUNTER — Other Ambulatory Visit: Payer: Self-pay | Admitting: Acute Care

## 2023-09-16 DIAGNOSIS — Z87891 Personal history of nicotine dependence: Secondary | ICD-10-CM

## 2023-09-16 DIAGNOSIS — Z122 Encounter for screening for malignant neoplasm of respiratory organs: Secondary | ICD-10-CM

## 2023-10-11 ENCOUNTER — Encounter: Payer: Self-pay | Admitting: Cardiology

## 2023-10-11 ENCOUNTER — Ambulatory Visit: Payer: Medicare HMO | Attending: Cardiology | Admitting: Cardiology

## 2023-10-11 VITALS — BP 138/84 | HR 91 | Ht 66.0 in | Wt 160.0 lb

## 2023-10-11 DIAGNOSIS — E785 Hyperlipidemia, unspecified: Secondary | ICD-10-CM | POA: Diagnosis not present

## 2023-10-11 DIAGNOSIS — I2510 Atherosclerotic heart disease of native coronary artery without angina pectoris: Secondary | ICD-10-CM | POA: Diagnosis not present

## 2023-10-11 DIAGNOSIS — I359 Nonrheumatic aortic valve disorder, unspecified: Secondary | ICD-10-CM

## 2023-10-11 DIAGNOSIS — E782 Mixed hyperlipidemia: Secondary | ICD-10-CM | POA: Diagnosis not present

## 2023-10-11 MED ORDER — ASPIRIN 81 MG PO TBEC: 81.0000 mg | DELAYED_RELEASE_TABLET | Freq: Every day | ORAL | Status: AC

## 2023-10-11 NOTE — Patient Instructions (Signed)
Medication Instructions:  Please start Aspirin 81 mg a day. Continue all other medications as listed.  *If you need a refill on your cardiac medications before your next appointment, please call your pharmacy*  Follow-Up: At [facility], you and your health needs are our priority.  As part of our continuing mission to provide you with exceptional heart care, we have created designated Provider Care Teams.  These Care Teams include your primary Cardiologist (physician) and Advanced Practice Providers (APPs -  Physician Assistants and Nurse Practitioners) who all work together to provide you with the care you need, when you need it.  We recommend signing up for the patient portal called "MyChart".  Sign up information is provided on this After Visit Summary.  MyChart is used to connect with patients for Virtual Visits (Telemedicine).  Patients are able to view lab/test results, encounter notes, upcoming appointments, etc.  Non-urgent messages can be sent to your provider as well.   To learn more about what you can do with MyChart, go to [email or web address].    Your next appointment:   1 year(s)  Provider:   Jari Favre, PA-C, Robin Searing, NP, Jacolyn Reedy, PA-C, Eligha Bridegroom, NP, Tereso Newcomer, PA-C, or Perlie Gold, PA-C           1st Floor: - Lobby - Registration  - Pharmacy  - Lab - Cafe  2nd Floor: - PV Lab - Diagnostic Testing (echo, CT, nuclear med)  3rd Floor: - Vacant  4th Floor: - TCTS (cardiothoracic surgery) - AFib Clinic - Structural Heart Clinic - Vascular Surgery  - Vascular Ultrasound  5th Floor: - [facility] Cardiology (general and EP) - Clinical Pharmacy for coumadin, hypertension, lipid, weight-loss medications, and med management appointments    Valet parking services will be available as well.

## 2023-10-11 NOTE — Progress Notes (Signed)
Cardiology Office Note:  .   Date:  10/11/2023  ID:  Real Cons, DOB 07-14-1951, MRN [id number] PCP: Milus Height, PA  [facility] Providers Cardiologist:  Donato Schultz, MD     History of Present Illness: .   Charles Jackson is a 73 y.o. male Discussed the use of AI scribe  History of Present Illness   Charles Jackson is a 73 year old male with coronary artery disease and aortic valve sclerosis who presents for follow-up.  He has a history of coronary artery disease and aortic valve sclerosis and is here for follow-up regarding his coronary artery disease and management of his cholesterol levels. He has experienced statin intolerance with atorvastatin and rosuvastatin, which caused significant muscle pain and weakness, particularly affecting his ability to play golf. His left arm, right arm, chest, and legs were sore, and he experienced constipation during this time.  He was initially on atorvastatin 10 mg and rosuvastatin 5 mg, which led to muscle pains and hand weakness. Due to these side effects, he switched to Zetia 10 mg, which improved his symptoms but he still experienced some cramping in his hands and calves. After evaluation at a lipid clinic, he was started on pravastatin 40 mg and continued with Zetia. However, his LDL levels were not at goal, so he started Repatha, which significantly lowered his LDL from 92 to 11.  His current medications include pravastatin and Repatha, and he is no longer taking Zetia. He mentions experiencing some muscle soreness, which he attributes to age or possibly the medication, but he does not have any significant side effects that would make him want to stop the medication.  He has a history of a CT scan for lung cancer screening, which showed coronary artery calcification per. He also mentions a recent sinus infection with no fever and some blood-tinged mucus, which has since resolved. No diabetes, with a recent A1c of 5.8.  In terms of  social history, his wife passed away in 24-Oct-2019, and he is not a cook, often eating precooked chicken and very little red meat. He enjoys walking and playing golf, although he has noticed some soreness in his feet, which feel bruised on the bottom.        No CP, no SOB  Studies Reviewed: Marland Kitchen   EKG Interpretation Date/Time:  10/24/2023 October 11 2023 10:07:23 EST Ventricular Rate:  91 PR Interval:  148 QRS Duration:  84 QT Interval:  358 QTC Calculation: 440 R Axis:   75  Text Interpretation: Normal sinus rhythm Normal ECG When compared with ECG of 20-Aug-2010 10:28, Rate faster Confirmed by Donato Schultz ([id number]) on 10/11/2023 10:22:41 AM    Results   LABS LDL: 11 mg/dL X9J: 4.7%  RADIOLOGY CT scan: Coronary artery calcification (09/06/2023)     Risk Assessment/Calculations:            Physical Exam:   VS:  BP 138/84   Pulse 91   Ht 5\' 6"  (1.676 m)   Wt 160 lb (72.6 kg)   SpO2 99%   BMI 25.82 kg/m    Wt Readings from Last 3 Encounters:  10/11/23 160 lb (72.6 kg)  10/07/22 149 lb 6.4 oz (67.8 kg)  09/04/22 147 lb (66.7 kg)    GEN: Well nourished, well developed in no acute distress NECK: No JVD; No carotid bruits CARDIAC: RRR, no murmurs, no rubs, no gallops RESPIRATORY:  Clear to auscultation without rales, wheezing or rhonchi  ABDOMEN: Soft, non-tender,  non-distended EXTREMITIES:  No edema; No deformity   ASSESSMENT AND PLAN: .    Assessment and Plan    Coronary Artery Disease with Aortic Valve Sclerosis 73 year old male with coronary artery disease and aortic valve sclerosis. Statin intolerance with atorvastatin and rosuvastatin caused muscle pains and hand weakness, affecting daily activities. Currently on pravastatin 40 mg and Repatha, with significant LDL reduction (most recent LDL: 11, down from 92). Residual symptoms include hand and calf cramping, and occasional muscle soreness, likely from pravastatin. Coronary artery calcification noted on CT scan. Discussed  plaque stabilization to prevent myocardial infarction, benefits of Repatha despite cost, and importance of diet and exercise. - Continue pravastatin 40 mg - Continue Repatha-excellent results - Start baby aspirin 81 mg daily for additional cardiovascular protection - watch for bleeding - Follow up in one year with APP for stability assessment  Statin Intolerance Significant muscle pains and hand weakness with atorvastatin and rosuvastatin led to discontinuation. Currently tolerating pravastatin with some residual muscle cramping.   Pre-Diabetes A1c is 5.8, indicating pre-diabetes. At risk due to family history. - Encourage lifestyle modifications including diet and exercise - Monitor A1c levels regularly  General Health Maintenance Engages in regular physical activity (golfing and walking). Diet includes precooked chicken, minimal red meat, and some processed ham. Reports adequate hydration. - Encourage continued physical activity - Advise on balanced diet with reduced processed meats and salt intake - Ensure adequate hydration  Follow-up - Schedule follow-up appointment in one year with APP - Schedule follow-up appointment in two years with cardiologist.               Signed, Donato Schultz, MD

## 2023-10-15 ENCOUNTER — Other Ambulatory Visit (HOSPITAL_COMMUNITY): Payer: Self-pay

## 2023-10-15 ENCOUNTER — Telehealth: Payer: Self-pay | Admitting: Cardiology

## 2023-10-15 NOTE — Telephone Encounter (Signed)
Pt c/o medication issue:  1. Name of Medication:   Evolocumab (REPATHA SURECLICK) 140 MG/ML SOAJ    2. How are you currently taking this medication (dosage and times per day)?    3. Are you having a reaction (difficulty breathing--STAT)? no  4. What is your medication issue? Patient is calling because the cost of medication had went up. He is calling to see whatever options are there. Please advise

## 2023-10-18 ENCOUNTER — Telehealth: Payer: Self-pay

## 2023-10-18 ENCOUNTER — Other Ambulatory Visit (HOSPITAL_COMMUNITY): Payer: Self-pay

## 2023-10-18 NOTE — Telephone Encounter (Addendum)
 Enrolled in grant. [employer] and instructions faxed to pharmacy. Pharmacy will call patient when medication is ready for $0.

## 2023-10-18 NOTE — Telephone Encounter (Signed)
 Patient Advocate Encounter   The patient was approved for a [employer] grant that will help cover the cost of REPATHA Total amount awarded, $2,500.  Effective: 09/18/23 - 09/16/24   [id number] [id number]   Pharmacy provided with approval and processing information.    Haze Rushing, CPhT  Pharmacy Patient Advocate Specialist  Direct Number: [phone] Fax: [phone]

## 2023-11-03 ENCOUNTER — Other Ambulatory Visit: Payer: Self-pay | Admitting: Interventional Cardiology

## 2024-01-02 ENCOUNTER — Other Ambulatory Visit: Payer: Self-pay | Admitting: Interventional Cardiology

## 2024-07-04 DIAGNOSIS — H5203 Hypermetropia, bilateral: Secondary | ICD-10-CM | POA: Diagnosis not present

## 2024-07-04 DIAGNOSIS — H11042 Peripheral pterygium, stationary, left eye: Secondary | ICD-10-CM | POA: Diagnosis not present

## 2024-07-04 DIAGNOSIS — H52223 Regular astigmatism, bilateral: Secondary | ICD-10-CM | POA: Diagnosis not present

## 2024-07-04 DIAGNOSIS — H43812 Vitreous degeneration, left eye: Secondary | ICD-10-CM | POA: Diagnosis not present

## 2024-07-04 DIAGNOSIS — Z0100 Encounter for examination of eyes and vision without abnormal findings: Secondary | ICD-10-CM | POA: Diagnosis not present

## 2024-07-04 DIAGNOSIS — H2513 Age-related nuclear cataract, bilateral: Secondary | ICD-10-CM | POA: Diagnosis not present

## 2024-07-12 DIAGNOSIS — Z0000 Encounter for general adult medical examination without abnormal findings: Secondary | ICD-10-CM | POA: Diagnosis not present

## 2024-07-12 DIAGNOSIS — Z85038 Personal history of other malignant neoplasm of large intestine: Secondary | ICD-10-CM | POA: Diagnosis not present

## 2024-07-12 DIAGNOSIS — Z125 Encounter for screening for malignant neoplasm of prostate: Secondary | ICD-10-CM | POA: Diagnosis not present

## 2024-07-12 DIAGNOSIS — E7800 Pure hypercholesterolemia, unspecified: Secondary | ICD-10-CM | POA: Diagnosis not present

## 2024-07-12 DIAGNOSIS — Z1331 Encounter for screening for depression: Secondary | ICD-10-CM | POA: Diagnosis not present

## 2024-07-12 DIAGNOSIS — Z23 Encounter for immunization: Secondary | ICD-10-CM | POA: Diagnosis not present

## 2024-07-12 DIAGNOSIS — R7303 Prediabetes: Secondary | ICD-10-CM | POA: Diagnosis not present

## 2024-07-12 DIAGNOSIS — J439 Emphysema, unspecified: Secondary | ICD-10-CM | POA: Diagnosis not present

## 2024-07-12 DIAGNOSIS — I700 Atherosclerosis of aorta: Secondary | ICD-10-CM | POA: Diagnosis not present

## 2024-07-12 DIAGNOSIS — I2510 Atherosclerotic heart disease of native coronary artery without angina pectoris: Secondary | ICD-10-CM | POA: Diagnosis not present

## 2024-09-07 ENCOUNTER — Ambulatory Visit
Admission: RE | Admit: 2024-09-07 | Discharge: 2024-09-07 | Disposition: A | Discharge status: Home / Self Care | Payer: Self-pay | Source: Ambulatory Visit | Attending: Acute Care | Admitting: Acute Care

## 2024-09-07 DIAGNOSIS — Z87891 Personal history of nicotine dependence: Secondary | ICD-10-CM | POA: Insufficient documentation

## 2024-09-07 DIAGNOSIS — Z122 Encounter for screening for malignant neoplasm of respiratory organs: Secondary | ICD-10-CM | POA: Diagnosis present
